# Patient Record
Sex: Male | Born: 1995 | Race: White | Hispanic: No | Marital: Single | Smoking: Never smoker
Health system: Southern US, Community
[De-identification: ages and names within clinical notes are randomized; demographics above are authoritative.]

---

## 2020-06-23 ENCOUNTER — Emergency Department (HOSPITAL_COMMUNITY)

## 2020-06-23 ENCOUNTER — Emergency Department (HOSPITAL_COMMUNITY)
Admission: EM | Admit: 2020-06-23 | Discharge: 2020-06-23 | Disposition: A | Discharge status: Home / Self Care | Attending: Emergency Medicine | Admitting: Emergency Medicine

## 2020-06-23 ENCOUNTER — Other Ambulatory Visit: Payer: Self-pay

## 2020-06-23 ENCOUNTER — Encounter (HOSPITAL_COMMUNITY): Payer: Self-pay | Admitting: *Deleted

## 2020-06-23 DIAGNOSIS — R0781 Pleurodynia: Secondary | ICD-10-CM | POA: Insufficient documentation

## 2020-06-23 DIAGNOSIS — R1011 Right upper quadrant pain: Secondary | ICD-10-CM | POA: Diagnosis present

## 2020-06-23 LAB — CBC
HCT: 47 % (ref 39.0–52.0)
Hemoglobin: 15.8 g/dL (ref 13.0–17.0)
MCH: 30.7 pg (ref 26.0–34.0)
MCHC: 33.6 g/dL (ref 30.0–36.0)
MCV: 91.3 fL (ref 80.0–100.0)
Platelets: 162 10*3/uL (ref 150–400)
RBC: 5.15 MIL/uL (ref 4.22–5.81)
RDW: 13.1 % (ref 11.5–15.5)
WBC: 4.9 10*3/uL (ref 4.0–10.5)
nRBC: 0 % (ref 0.0–0.2)

## 2020-06-23 LAB — COMPREHENSIVE METABOLIC PANEL
ALT: 13 U/L (ref 0–44)
AST: 21 U/L (ref 15–41)
Albumin: 4.7 g/dL (ref 3.5–5.0)
Alkaline Phosphatase: 85 U/L (ref 38–126)
Anion gap: 8 (ref 5–15)
BUN: 10 mg/dL (ref 6–20)
CO2: 28 mmol/L (ref 22–32)
Calcium: 9.8 mg/dL (ref 8.9–10.3)
Chloride: 101 mmol/L (ref 98–111)
Creatinine, Ser: 0.73 mg/dL (ref 0.61–1.24)
GFR, Estimated: >60 mL/min (ref >60)
Glucose, Bld: 90 mg/dL (ref 70–99)
Potassium: 4.5 mmol/L (ref 3.5–5.1)
Sodium: 137 mmol/L (ref 135–145)
Total Bilirubin: 0.9 mg/dL (ref 0.3–1.2)
Total Protein: 7.4 g/dL (ref 6.5–8.1)

## 2020-06-23 LAB — URINALYSIS, ROUTINE W REFLEX MICROSCOPIC
Bilirubin Urine: NEGATIVE
Glucose, UA: NEGATIVE mg/dL
Hgb urine dipstick: NEGATIVE
Ketones, ur: NEGATIVE mg/dL
Leukocytes,Ua: NEGATIVE
Nitrite: NEGATIVE
Protein, ur: NEGATIVE mg/dL
Specific Gravity, Urine: 1.005 (ref 1.005–1.030)
pH: 8 (ref 5.0–8.0)

## 2020-06-23 LAB — LIPASE, BLOOD: Lipase: 28 U/L (ref 11–51)

## 2020-06-23 MED ORDER — DICYCLOMINE HCL 10 MG PO CAPS
10.0000 mg | ORAL_CAPSULE | Freq: Once | ORAL | Status: AC
  Administered 2020-06-23: 10 mg via ORAL
  Filled 2020-06-23: qty 1

## 2020-06-23 NOTE — Discharge Instructions (Signed)
Your labs and imaging show no abnormality at this time. It does not appear that you are having an emergent cause of your pain.  Please follow-up with your medical provider as needed

## 2020-06-23 NOTE — ED Provider Notes (Signed)
[facility] EMERGENCY DEPARTMENT Provider Note   CSN: [id number] Arrival date & time: 06/23/20  1008     History Chief Complaint  Patient presents with  . Abdominal Pain    Kyle Galloway is a 25 y.o. male with no significant past medical history who presents to [facility] ED for RUQ abdominal pain. Patient reports that on Tuesday (2/8) he started feeling pain "underneath right lower rib." He describes the pain as "pressure" and "bloating" and is constant. He reports nothing makes the pain better or worse. Pain is not associated with meals. Patient denies radiation of pain. He denies fevers, chills. He denies any skin changes, rashes, or masses. He denies dysuria, urinary urgency or frequency. He reports last bowel movement was this morning and have been normal.     HPI     History reviewed. No pertinent past medical history.  There are no problems to display for this patient.   History reviewed. No pertinent surgical history.     No family history on file.  Social History   Tobacco Use  . Smoking status: Never Smoker  . Smokeless tobacco: Never Used  Substance Use Topics  . Alcohol use: Not Currently  . Drug use: Not Currently    Home Medications Prior to Admission medications   Not on File    Allergies    Benadryl [diphenhydramine]  Review of Systems   Review of Systems Ten systems reviewed and are negative for acute change, except as noted in the HPI.   Physical Exam Updated Vital Signs BP 111/65   Pulse 78   Temp 97.6 F (36.4 C)   Resp 19   Ht 5\' 10"  (1.778 m)   Wt 74.4 kg   SpO2 99%   BMI 23.53 kg/m   Physical Exam Vitals and nursing note reviewed.  Constitutional:      General: He is not in acute distress.    Appearance: He is well-developed and well-nourished. He is not diaphoretic.  HENT:     Head: Normocephalic and atraumatic.  Eyes:     General: No scleral icterus.    Conjunctiva/sclera: Conjunctivae normal.  Cardiovascular:      Rate and Rhythm: Normal rate and regular rhythm.     Heart sounds: Normal heart sounds.  Pulmonary:     Effort: Pulmonary effort is normal. No respiratory distress.     Breath sounds: Normal breath sounds.  Abdominal:     Palpations: Abdomen is soft.     Tenderness: There is abdominal tenderness in the right upper quadrant.  Musculoskeletal:        General: No edema.     Cervical back: Normal range of motion and neck supple.  Skin:    General: Skin is warm and dry.  Neurological:     Mental Status: He is alert.  Psychiatric:        Behavior: Behavior normal.     ED Results / Procedures / Treatments   Labs (all labs ordered are listed, but only abnormal results are displayed) Labs Reviewed  URINALYSIS, ROUTINE W REFLEX MICROSCOPIC - Abnormal; Notable for the following components:      Result Value   Color, Urine STRAW (*)    All other components within normal limits  LIPASE, BLOOD  COMPREHENSIVE METABOLIC PANEL  CBC    EKG EKG Interpretation  Date/Time:  Friday June 23 2020 10:36:09 EST Ventricular Rate:  68 PR Interval:  148 QRS Duration: 116 QT Interval:  370 QTC Calculation: 393 R  Axis:   98 Text Interpretation: Normal sinus rhythm Rightward axis Incomplete right bundle branch block Cannot rule out Anterior infarct , age undetermined Abnormal ECG No previous ECGs available Confirmed by Vanetta Mulders [phone]) on 06/23/2020 10:50:13 AM   Radiology No results found.  Procedures Procedures  Medications Ordered in ED Medications - No data to display  ED Course  I have reviewed the triage vital signs and the nursing notes.  Pertinent labs & imaging results that were available during my care of the patient were reviewed by me and considered in my medical decision making (see chart for details).    MDM Rules/Calculators/A&P                           25 year old male here with abdominal pain in the right upper quadrant.The emergent DDX for RUQ pain  includes but is not limited to Glabladder disease, PUD, Acute Hepatitis, Pancreatitis, pyelonephritis, Pneumonia, Lower lobe PE/Infarct, Kidney stone, GERD, retrocecal appendicitis,syndrome, AAA, MI, Zoster. I ordered and reviewed labs which include CBC, CMP and lipase which no abnormalities.  Urine is somewhat dark but shows no evidence of infection.  I ordered labs including CBC, CMP and lipase all within normal limits, urinalysis is negative for infection.  I ordered and reviewed a right upper quadrant abdominal ultrasound which is negative for abnormality.  Patient's EKG shows sinus rhythm at a rate of 68.  He has a fairly benign abdominal exam.  Patient appears otherwise appropriate for discharge at this time.  Discussed return precautions and outpatient follow-up   Final Clinical Impression(s) / ED Diagnoses Final diagnoses:  RUQ abdominal pain    Rx / DC Orders ED Discharge Orders    None       Arthor Captain, PA-C 06/23/20 1854    Vanetta Mulders, MD 06/28/20 1606

## 2020-06-23 NOTE — ED Notes (Signed)
US completed

## 2020-06-23 NOTE — ED Triage Notes (Signed)
Right middle quadrant pain for 3 days

## 2022-08-20 IMAGING — US US ABDOMEN LIMITED RUQ/ASCITES
1 series · 14 of 25 positions shown · non-contrast
Comparison: None.

CLINICAL DATA: Right upper quadrant pain for several days

EXAM:
ULTRASOUND ABDOMEN LIMITED RIGHT UPPER QUADRANT

[Series 1: us abdomen limited ruq/ascites · 0.18mm/px · 14 of 41 slices shown]
[im 1/41]
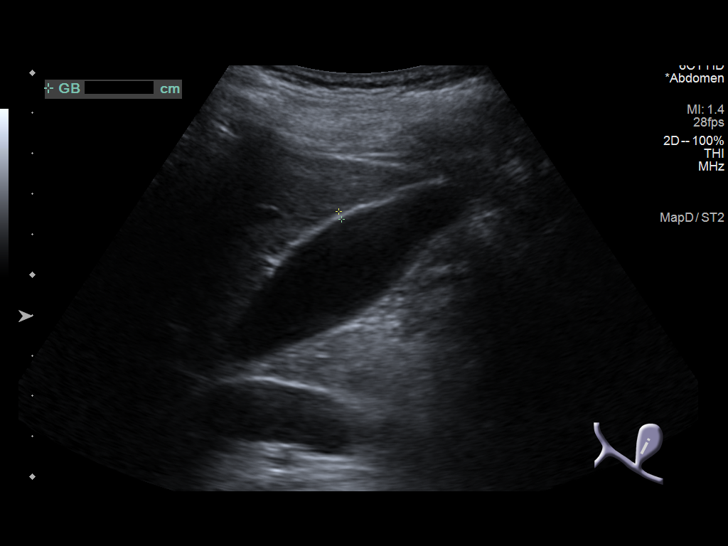
[im 4/41]
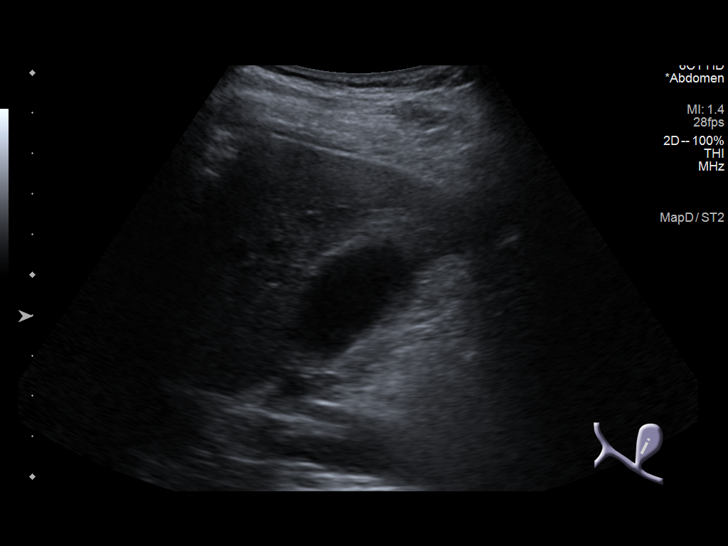
[im 7/41]
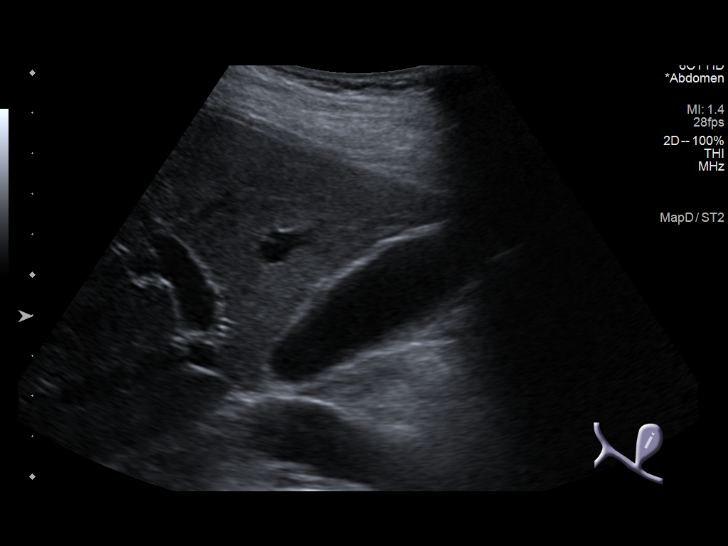
[im 11/41]
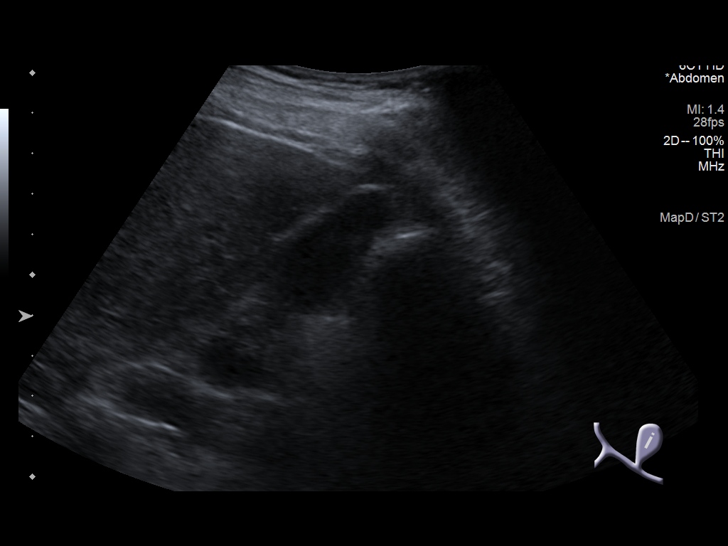
[im 14/41]
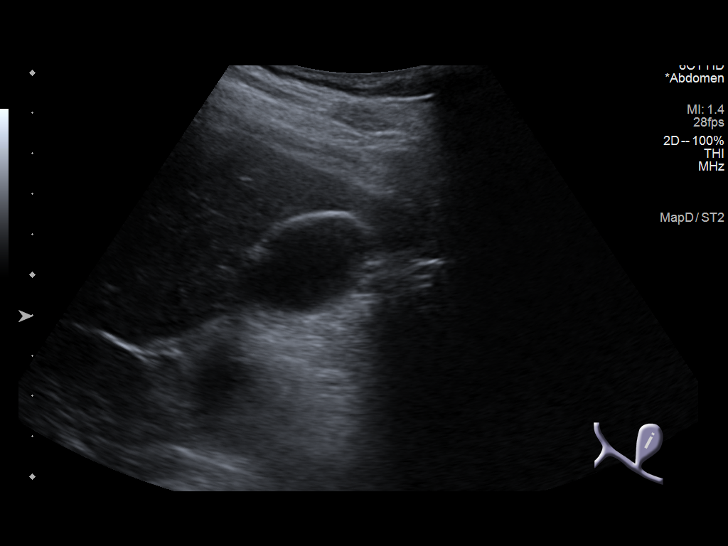
[im 16/41]
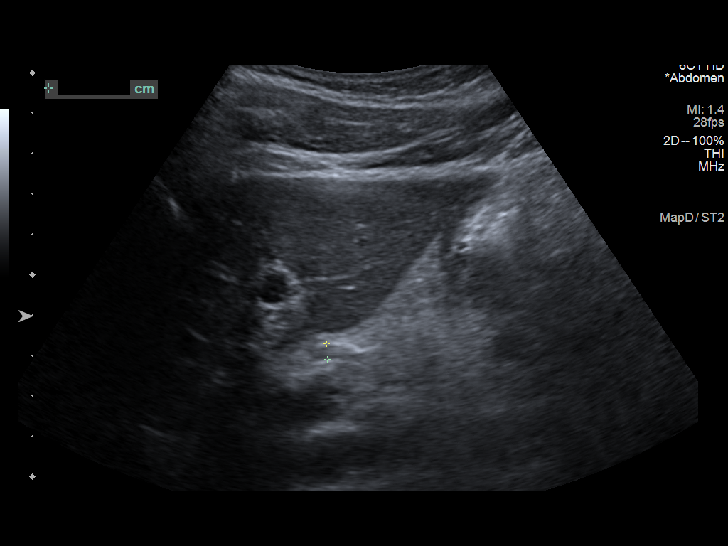
[im 19/41]
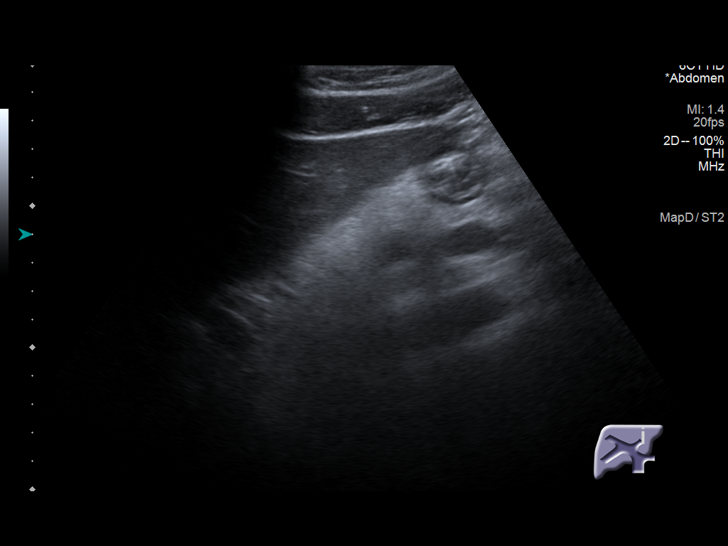
[im 22/41]
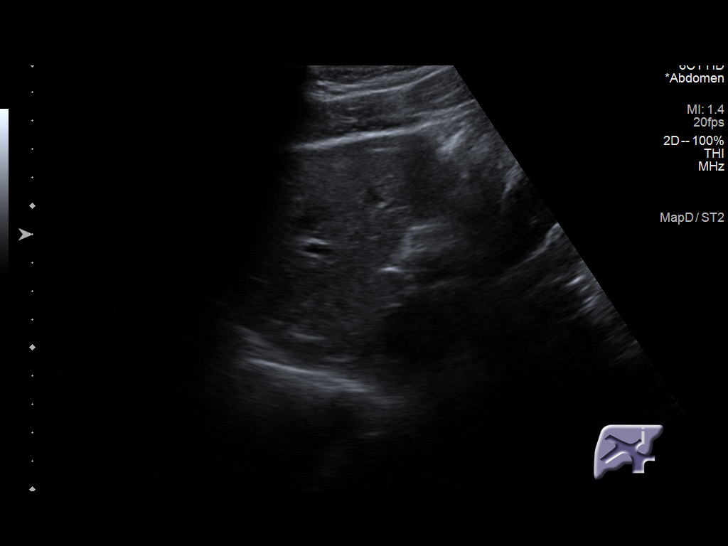
[im 26/41]
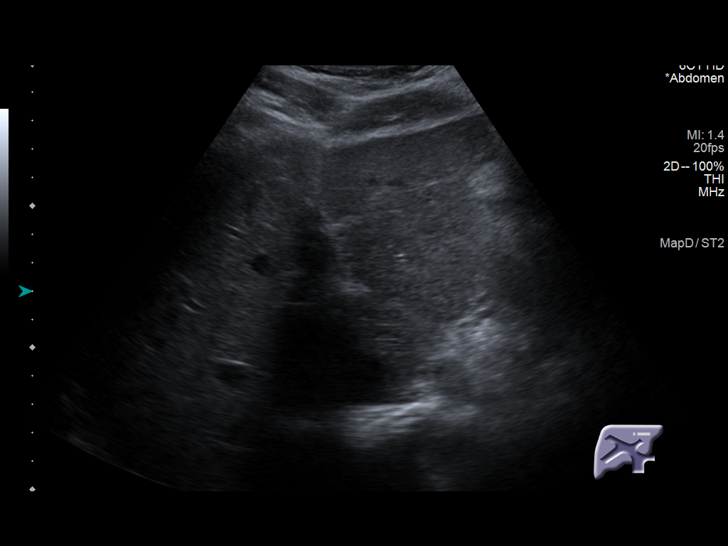
[im 27/41]
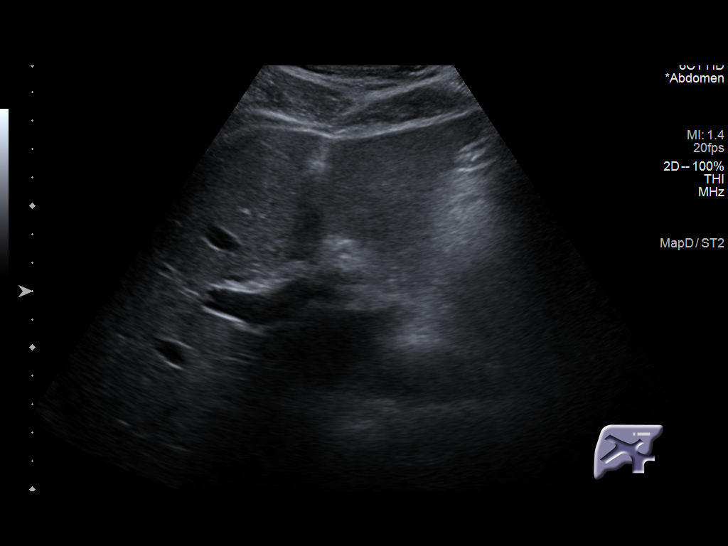
[im 31/41]
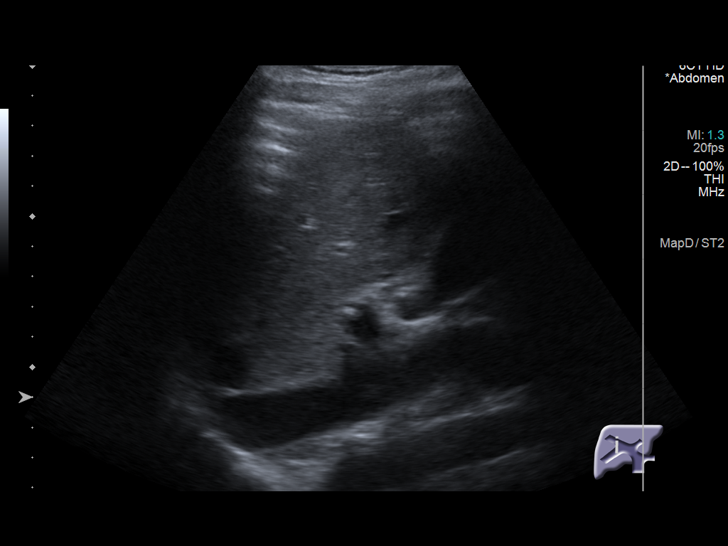
[im 34/41]
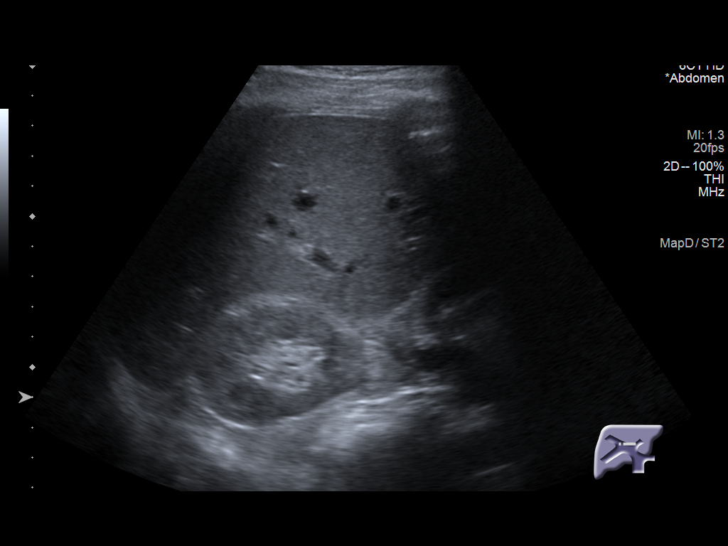
[im 37/41]
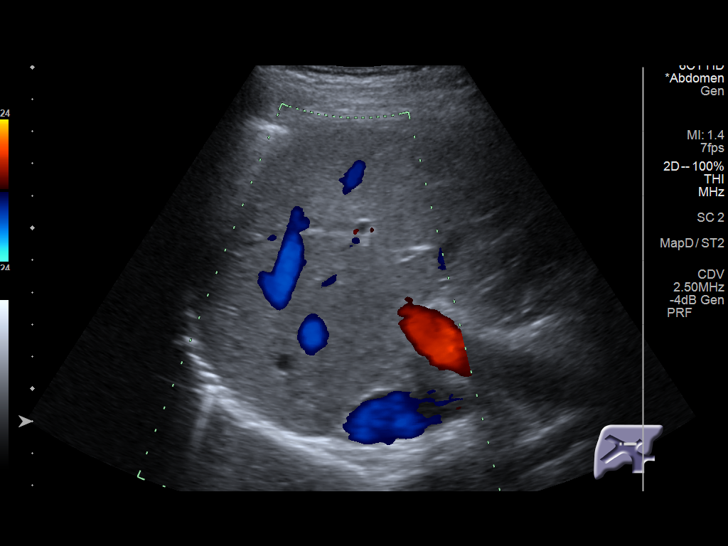
[im 41/41]
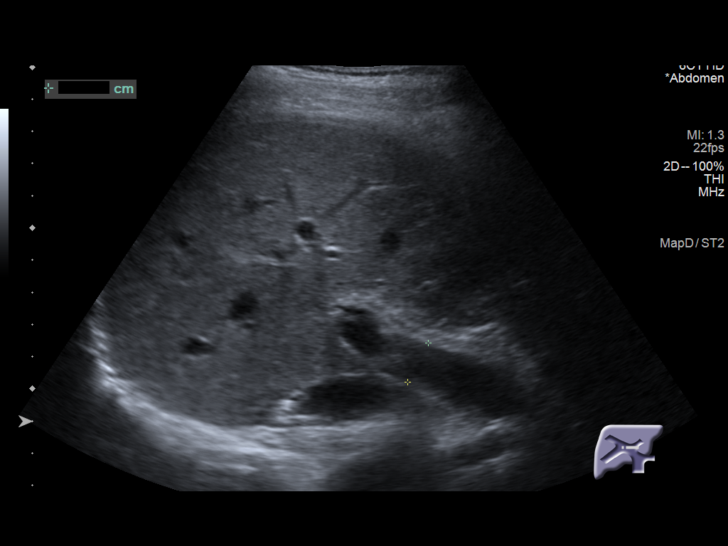

[14 of 25 positions shown; findings below may reference images not displayed]

FINDINGS: Gallbladder:

No gallstones or wall thickening visualized. No sonographic Murphy
sign noted by sonographer.

Common bile duct:

Diameter: 4 mm

Liver:

No focal lesion identified. Within normal limits in parenchymal
echogenicity. Portal vein is patent on color Doppler imaging with
normal direction of blood flow towards the liver.

Other: None.
IMPRESSION: No acute abnormality noted.
